# Patient Record
Sex: Female | Born: 1975 | Race: White | Hispanic: No | Marital: Married | Smoking: Never smoker
Health system: Southern US, Community
[De-identification: ages and names within clinical notes are randomized; demographics above are authoritative.]

## PROBLEM LIST (undated history)

## (undated) DIAGNOSIS — N200 Calculus of kidney: Secondary | ICD-10-CM

## (undated) DIAGNOSIS — E7800 Pure hypercholesterolemia, unspecified: Secondary | ICD-10-CM

## (undated) DIAGNOSIS — G43909 Migraine, unspecified, not intractable, without status migrainosus: Secondary | ICD-10-CM

## (undated) HISTORY — PX: KNEE SURGERY: SHX244

---

## 2016-10-09 ENCOUNTER — Emergency Department (HOSPITAL_COMMUNITY): Payer: 59

## 2016-10-09 ENCOUNTER — Encounter (HOSPITAL_COMMUNITY): Payer: Self-pay | Admitting: Emergency Medicine

## 2016-10-09 ENCOUNTER — Emergency Department (HOSPITAL_COMMUNITY)
Admission: EM | Admit: 2016-10-09 | Discharge: 2016-10-09 | Disposition: A | Discharge status: Home / Self Care | Payer: 59 | Attending: Emergency Medicine | Admitting: Emergency Medicine

## 2016-10-09 DIAGNOSIS — R42 Dizziness and giddiness: Secondary | ICD-10-CM | POA: Diagnosis not present

## 2016-10-09 DIAGNOSIS — Z79899 Other long term (current) drug therapy: Secondary | ICD-10-CM | POA: Diagnosis not present

## 2016-10-09 DIAGNOSIS — R51 Headache: Secondary | ICD-10-CM | POA: Diagnosis not present

## 2016-10-09 DIAGNOSIS — R202 Paresthesia of skin: Secondary | ICD-10-CM | POA: Insufficient documentation

## 2016-10-09 DIAGNOSIS — R110 Nausea: Secondary | ICD-10-CM | POA: Diagnosis not present

## 2016-10-09 DIAGNOSIS — R0781 Pleurodynia: Secondary | ICD-10-CM | POA: Insufficient documentation

## 2016-10-09 DIAGNOSIS — M546 Pain in thoracic spine: Secondary | ICD-10-CM | POA: Insufficient documentation

## 2016-10-09 HISTORY — DX: Migraine, unspecified, not intractable, without status migrainosus: G43.909

## 2016-10-09 HISTORY — DX: Pure hypercholesterolemia, unspecified: E78.00

## 2016-10-09 HISTORY — DX: Calculus of kidney: N20.0

## 2016-10-09 LAB — BASIC METABOLIC PANEL
ANION GAP: 9 (ref 5–15)
BUN: 9 mg/dL (ref 6–20)
CHLORIDE: 106 mmol/L (ref 101–111)
CO2: 23 mmol/L (ref 22–32)
Calcium: 8.6 mg/dL — ABNORMAL LOW (ref 8.9–10.3)
Creatinine, Ser: 0.68 mg/dL (ref 0.44–1.00)
GFR calc Af Amer: >60 mL/min (ref >60)
Glucose, Bld: 126 mg/dL — ABNORMAL HIGH (ref 65–99)
POTASSIUM: 3.9 mmol/L (ref 3.5–5.1)
Sodium: 138 mmol/L (ref 135–145)

## 2016-10-09 LAB — CBC
HEMATOCRIT: 40.7 % (ref 36.0–46.0)
HEMOGLOBIN: 13.7 g/dL (ref 12.0–15.0)
MCH: 33.1 pg (ref 26.0–34.0)
MCHC: 33.7 g/dL (ref 30.0–36.0)
MCV: 98.3 fL (ref 78.0–100.0)
Platelets: 193 10*3/uL (ref 150–400)
RBC: 4.14 MIL/uL (ref 3.87–5.11)
RDW: 11.8 % (ref 11.5–15.5)
WBC: 5.2 10*3/uL (ref 4.0–10.5)

## 2016-10-09 LAB — I-STAT BETA HCG BLOOD, ED (MC, WL, AP ONLY)

## 2016-10-09 LAB — TROPONIN I: Troponin I: <0.03 ng/mL (ref <0.03)

## 2016-10-09 LAB — D-DIMER, QUANTITATIVE: D-Dimer, Quant: 0.65 ug/mL-FEU — ABNORMAL HIGH (ref 0.00–0.50)

## 2016-10-09 MED ORDER — METHOCARBAMOL 500 MG PO TABS: 500.0000 mg | ORAL_TABLET | Freq: Three times a day (TID) | ORAL | 0 refills | Status: AC | PRN

## 2016-10-09 MED ORDER — IBUPROFEN 600 MG PO TABS: 600.0000 mg | ORAL_TABLET | Freq: Four times a day (QID) | ORAL | 0 refills | Status: AC | PRN

## 2016-10-09 MED ORDER — ONDANSETRON HCL 4 MG/2ML IJ SOLN
4.0000 mg | Freq: Once | INTRAMUSCULAR | Status: AC
  Administered 2016-10-09: 4 mg via INTRAVENOUS
  Filled 2016-10-09: qty 2

## 2016-10-09 MED ORDER — DIAZEPAM 5 MG/ML IJ SOLN
5.0000 mg | Freq: Once | INTRAMUSCULAR | Status: AC
  Administered 2016-10-09: 5 mg via INTRAVENOUS
  Filled 2016-10-09: qty 2

## 2016-10-09 MED ORDER — IOPAMIDOL (ISOVUE-370) INJECTION 76%
100.0000 mL | Freq: Once | INTRAVENOUS | Status: AC | PRN
  Administered 2016-10-09: 100 mL via INTRAVENOUS

## 2016-10-09 MED ORDER — MORPHINE SULFATE (PF) 4 MG/ML IV SOLN
4.0000 mg | Freq: Once | INTRAVENOUS | Status: AC
  Administered 2016-10-09: 4 mg via INTRAVENOUS
  Filled 2016-10-09: qty 1

## 2016-10-09 NOTE — ED Notes (Signed)
Pt made aware to return if symptoms worsen or if any life threatening symptoms occur.  Pt has no complaints at discharge.

## 2016-10-09 NOTE — ED Notes (Signed)
Pt back from x-ray.

## 2016-10-09 NOTE — ED Triage Notes (Signed)
Per EMS: Pt reports sleeping and waking up a few hours ago, having pain in left shoulder blade radiating into spine and pain into left arm and into left neck with and without movement. Pain with palpation. Pt also reports left hand numbness.

## 2016-10-09 NOTE — ED Provider Notes (Signed)
[facility]-EM[facility]Y DEPT Provider Note   CSN: [id number] Arrival date & time: 10/09/16  1348     History   Chief Complaint Chief Complaint  Patient presents with  . Chest Pain    HPI Christy Lyons is a 41 y.o. female.  The history is provided by the patient.  Back Pain   This is a new problem. The current episode started 3 to 5 hours ago. The problem occurs constantly. The problem has been gradually worsening. The pain is associated with no known injury. Pain location: left scapula. The quality of the pain is described as stabbing. Radiates to: left neck and down left arm. The pain is severe. The symptoms are aggravated by certain positions (deep inspiration). The pain is the same all the time. Associated symptoms include headaches and tingling. Pertinent negatives include no fever, no abdominal pain, no abdominal swelling, no bowel incontinence, no perianal numbness, no dysuria, no leg pain and no weakness. Numbness: tingling of the left hand. She has tried heat for the symptoms. The treatment provided no relief.   41 year old female who presents with upper back pain that radiates to the neck and down the left arm starting this morning when she woke up at 10:30 AM. Pain gradually worsened, and does notice that it worsens if she might changes position or moves, and with palpation. Also worsens if she takes a deep breath. Has been feeling lightheaded and nauseous with this but no syncope. Has been having tingling in the left hand, but slowly improving. No fall or trauma or heavy lifting. No cough, fevers, difficulty breathing or syncope. There is never happened to her before. No recent immobilization or prior history of PE/DVT.  Past Medical History:  Diagnosis Date  . High cholesterol   . Kidney stone   . Migraine     There are no active problems to display for this patient.   Past Surgical History:  Procedure Laterality Date  . CESAREAN SECTION    . KNEE SURGERY      OB History     No data available       Home Medications    Prior to Admission medications   Medication Sig Start Date End Date Taking? Authorizing Provider  atorvastatin (LIPITOR) 40 MG tablet Take 20-40 mg by mouth every evening.    Yes Historical Provider, MD  escitalopram (LEXAPRO) 20 MG tablet Take 10-20 mg by mouth at bedtime.    Yes Historical Provider, MD  ibuprofen (ADVIL,MOTRIN) 200 MG tablet Take 800 mg by mouth daily as needed for moderate pain.   Yes Historical Provider, MD  ramelteon (ROZEREM) 8 MG tablet Take 8 mg by mouth at bedtime.   Yes Historical Provider, MD  topiramate (TOPAMAX) 25 MG tablet Take 50 mg by mouth every evening.   Yes Historical Provider, MD  ibuprofen (ADVIL,MOTRIN) 600 MG tablet Take 1 tablet (600 mg total) by mouth every 6 (six) hours as needed for mild pain or moderate pain (take with food/crackers). 10/09/16   [name], MD  methocarbamol (ROBAXIN) 500 MG tablet Take 1 tablet (500 mg total) by mouth every 8 (eight) hours as needed for muscle spasms. 10/09/16   [name], MD    Family History History reviewed. No pertinent family history.  Social History Social History  Substance Use Topics  . Smoking status: Never Smoker  . Smokeless tobacco: Not on file  . Alcohol use Yes     Allergies   Hydrocodone   Review of Systems  Review of Systems  Constitutional: Negative for fever.  HENT: Negative for congestion.   Respiratory: Negative for cough and shortness of breath.   Cardiovascular: Negative for leg swelling.  Gastrointestinal: Positive for nausea. Negative for abdominal pain, bowel incontinence and vomiting.  Genitourinary: Negative for dysuria.  Musculoskeletal: Positive for back pain.  Allergic/Immunologic: Negative for immunocompromised state.  Neurological: Positive for tingling, light-headedness and headaches. Negative for weakness. Numbness: tingling of the left hand.  Hematological: Does not bruise/bleed easily.    Psychiatric/Behavioral: Negative for confusion.  All other systems reviewed and are negative.    Physical Exam Updated Vital Signs BP 104/65   Pulse 68 Comment: Simultaneous filing. User may not have seen previous data.  Temp 98.1 F (36.7 C) (Oral)   Resp 18 Comment: Simultaneous filing. User may not have seen previous data.  Ht  (1.702 m)   Wt 156 lb (70.8 kg)   SpO2 100% Comment: Simultaneous filing. User may not have seen previous data.  BMI 24.43 kg/m   Physical Exam Physical Exam  Nursing note and vitals reviewed. Constitutional:non-toxic, and in no acute distress Head: Normocephalic and atraumatic.  Mouth/Throat: Oropharynx is clear and moist.  Neck: Normal range of motion. Neck supple.  Cardiovascular: Normal rate and regular rhythm.   Pulmonary/Chest: Effort normal and breath sounds normal. +2 dp and radial pulses bilaterally Abdominal: Soft. There is no tenderness. There is no rebound and no guarding.  Musculoskeletal: Normal range of motion. Tender over the left upper thoracic paraspinal muscles around the base of the left scapula Neurological: Alert, no facial droop, fluent speech, moves all extremities symmetrically Skin: Skin is warm and dry.  Psychiatric: Cooperative   ED Treatments / Results  Labs (all labs ordered are listed, but only abnormal results are displayed) Labs Reviewed  BASIC METABOLIC PANEL - Abnormal; Notable for the following:       Result Value   Glucose, Bld 126 (*)    Calcium 8.6 (*)    All other components within normal limits  D-DIMER, QUANTITATIVE (NOT AT [facility]) - Abnormal; Notable for the following:    D-Dimer, Quant 0.65 (*)    All other components within normal limits  CBC  TROPONIN I  TROPONIN I  I-STAT BETA HCG BLOOD, ED ([facility], [facility], [facility] ONLY)    EKG  EKG Interpretation  Date/Time:  Tuesday October 09 2016 13:59:12 EDT Ventricular Rate:  76 PR Interval:    QRS Duration: 96 QT Interval:  383 QTC Calculation: 431 R  Axis:   84 Text Interpretation:  Sinus rhythm no prior EKG no acute ischemic changes  Confirmed by Smitty Ackerley MD, Jamar Weatherall [phone]) on 10/09/2016 2:07:51 PM       Radiology Dg Chest 2 View  Result Date: 10/09/2016 CLINICAL DATA:  Chest pain, left hand numbness EXAM: CHEST  2 VIEW COMPARISON:  None. FINDINGS: No active infiltrate or effusion is seen. Mediastinal and hilar contours are unremarkable. The heart is within normal limits in size. No bony abnormality is seen. IMPRESSION: No active cardiopulmonary disease. Electronically Signed   By: Dwyane Dee M.D.   On: 10/09/2016 15:22   Ct Angio Chest Pe W And/or Wo Contrast  Result Date: 10/09/2016 CLINICAL DATA:  New onset left scapular pain. Pleuritic left-sided chest pain. Left arm tingling and lightheadedness. EXAM: CT ANGIOGRAPHY CHEST WITH CONTRAST TECHNIQUE: Multidetector CT imaging of the chest was performed using the standard protocol during bolus administration of intravenous contrast. Multiplanar CT image reconstructions and MIPs were obtained to evaluate  the vascular anatomy. CONTRAST:  100 mL Isovue 370 COMPARISON:  Two-view chest x-ray from the same day. FINDINGS: Cardiovascular: The heart size is normal. No significant pericardial effusion is present. The aortic arch is normal. There is no evidence for dissection within the aortic arch or descending aorta. Aorta is of normal caliber. Pulmonary arterial opacification is excellent. There are no focal filling defects to suggest pulmonary emboli. Pulmonary arteries are of normal size. Mediastinum/Nodes: No significant mediastinal or axillary adenopathy is present. The esophagus is unremarkable. Lungs/Pleura: The lungs are clear. No focal nodule, mass, or airspace disease is present. There is no significant pleural effusion. Upper Abdomen: Limited imaging of the upper abdomen is unremarkable. Musculoskeletal: Bone windows are unremarkable. The visualized ribs are intact. The scapulae are normal bilaterally.  Visualized vertebral body heights and alignment are normal. There is no focal lytic or blastic lesion. No acute fracture is present. Review of the MIP images confirms the above findings. IMPRESSION: 1. Negative CTA of the chest. 2. No evidence for pulmonary embolus or aortic dissection. 3. No acute or focal lesion to explain the patient's left-sided chest pain. Electronically Signed   By: Marin Roberts M.D.   On: 10/09/2016 16:49    Procedures Procedures (including critical care time)  Medications Ordered in ED Medications  morphine 4 MG/ML injection 4 mg (4 mg Intravenous Given 10/09/16 1500)  ondansetron (ZOFRAN) injection 4 mg (4 mg Intravenous Given 10/09/16 1458)  diazepam (VALIUM) injection 5 mg (5 mg Intravenous Given 10/09/16 1502)  iopamidol (ISOVUE-370) 76 % injection 100 mL (100 mLs Intravenous Contrast Given 10/09/16 1629)     Initial Impression / Assessment and Plan / ED Course  I have reviewed the triage vital signs and the nursing notes.  Pertinent labs & imaging results that were available during my care of the patient were reviewed by me and considered in my medical decision making (see chart for details).     Patient well appearing and in no acute distress with normal vital signs. Pain seems reproduced over left scapula. Low risk ACS, and serial troponins normal and with nonischemic EKG. CXR visualized and shows no acute cardiopulmonary processes. Patient with positive ddimer and underwent CT PE. CT visualized and shows no PE, dissection or other serious intrathoracic or cardiopulmonary processes. Felt to be ruled out for emergent processes and appropriate for discharge home with instructions to treat likely MSK strain. Strict return and follow-up instructions reviewed. She expressed understanding of all discharge instructions and felt comfortable with the plan of care.   Final Clinical Impressions(s) / ED Diagnoses   Final diagnoses:  Acute left-sided thoracic back  pain    New Prescriptions New Prescriptions   IBUPROFEN (ADVIL,MOTRIN) 600 MG TABLET    Take 1 tablet (600 mg total) by mouth every 6 (six) hours as needed for mild pain or moderate pain (take with food/crackers).   METHOCARBAMOL (ROBAXIN) 500 MG TABLET    Take 1 tablet (500 mg total) by mouth every 8 (eight) hours as needed for muscle spasms.     [name], MD 10/09/16 [phone]

## 2016-10-09 NOTE — Discharge Instructions (Signed)
Your CT scan of the chest and heart work up is reassuring. This is likely muscle strain. Take medications as prescribed. Follow-up with your PCP Return for worsening symptoms, including difficulty breathing, passing out, escalating pain or any other symptoms concerning to you.

## 2017-05-14 ENCOUNTER — Ambulatory Visit (INDEPENDENT_AMBULATORY_CARE_PROVIDER_SITE_OTHER): Payer: 59 | Admitting: Neurology

## 2017-05-14 ENCOUNTER — Encounter: Payer: Self-pay | Admitting: Neurology

## 2017-05-14 ENCOUNTER — Ambulatory Visit: Payer: 59 | Admitting: Neurology

## 2017-05-14 DIAGNOSIS — M79601 Pain in right arm: Secondary | ICD-10-CM

## 2017-05-14 NOTE — Progress Notes (Signed)
Please refer to EMG and nerve conduction study procedure note. 

## 2017-05-14 NOTE — Procedures (Signed)
     HISTORY:  Christy Lyons is a 41 year old patient with a history of onset of right shoulder and arm discomfort that began in June 2018.  She was putting together furniture and she heard a pop in her shoulder and has had discomfort down the arm since that time associated with some numbness of the right hand.  The patient is being evaluated for a possible neuropathy or a cervical radiculopathy.  NERVE CONDUCTION STUDIES:  Nerve conduction studies were performed on both upper extremities. The distal motor latencies and motor amplitudes for the median, radial, and ulnar nerves were within normal limits. The F wave latencies and nerve conduction velocities for the median and ulnar nerves were also normal. The sensory latencies for the median, radial, and ulnar nerves were normal.   EMG STUDIES:  EMG study was performed on the right upper extremity:  The first dorsal interosseous muscle reveals 2 to 4 K units with full recruitment. No fibrillations or positive waves were noted. The abductor pollicis brevis muscle reveals 2 to 4 K units with full recruitment. No fibrillations or positive waves were noted. The extensor indicis proprius muscle reveals 1 to 3 K units with full recruitment. No fibrillations or positive waves were noted. The pronator teres muscle reveals 2 to 3 K units with full recruitment. No fibrillations or positive waves were noted. The biceps muscle reveals 1 to 2 K units with full recruitment. No fibrillations or positive waves were noted. The triceps muscle reveals 2 to 4 K units with full recruitment. No fibrillations or positive waves were noted. The anterior deltoid muscle reveals 2 to 3 K units with full recruitment. No fibrillations or positive waves were noted. The cervical paraspinal muscles were tested at 2 levels. No abnormalities of insertional activity were seen at either level tested. There was poor relaxation.   IMPRESSION:  Nerve conduction studies done on  both upper extremities were within normal limits.  No evidence of a neuropathy is seen.  EMG evaluation of the right upper extremity was unremarkable without evidence of a cervical radiculopathy.  Marlan Palau. Keith Otniel Hoe MD 05/14/2017 3:55 PM  [facility] [address] [place], [place] [id number]  Phone [phone] Fax [phone]

## 2017-05-20 NOTE — Progress Notes (Signed)
[facility]    Nerve / Sites Muscle Latency Ref. Amplitude Ref. Rel Amp Segments Distance Velocity Ref. Area    ms ms mV mV %  cm m/s m/s mVms  L Median - APB     Wrist APB 2.8 ?4.4 13.5 ?4.0 100 Wrist - APB 7   48.8     Upper arm APB 6.6  13.5  99.4 Upper arm - Wrist 23 60 ?49 48.5  R Median - APB     Wrist APB 3.2 ?4.4 6.4 ?4.0 100 Wrist - APB 7   22.6     Upper arm APB 6.8  6.2  96.5 Upper arm - Wrist 23 65 ?49 22.8  L Ulnar - ADM     Wrist ADM 2.7 ?3.3 6.8 ?6.0 100 Wrist - ADM 7   22.7     B.Elbow ADM 5.9  6.2  90.7 B.Elbow - Wrist 21 65 ?49 21.9     A.Elbow ADM 7.4  5.7  92.5 A.Elbow - B.Elbow 10 66 ?49 21.2         A.Elbow - Wrist      R Ulnar - ADM     Wrist ADM 2.7 ?3.3 3.6 ?6.0 100 Wrist - ADM 7   12.3     B.Elbow ADM 6.4  3.1  85.6 B.Elbow - Wrist 21 56 ?49 10.9     A.Elbow ADM 8.0  2.9  91.8 A.Elbow - B.Elbow 10 62 ?49 9.3         A.Elbow - Wrist      L Radial - EIP     Forearm EIP 2.6 ?2.9 3.7 ?2.0 100 Forearm - EIP 4  ?49 28.7     Elbow EIP 4.9  4.7  127 Elbow - Forearm 19 83  35.7     Spiral Gr EIP 8.9  6.3  132 Spiral Gr - Elbow 38 95  44.4  R Radial - EIP     Forearm EIP 3.2 ?2.9 4.3 ?2.0 100 Forearm - EIP 4  ?49 27.1     Elbow EIP 6.0  4.4  103 Elbow - Forearm    29.0     Spiral Gr EIP 8.1  5.4  124 Spiral Gr - Elbow    34.6                 [facility]    Nerve / Sites Rec. Site Peak Lat Amp Segments Distance    ms V  cm  L Median - Orthodromic (Dig II, Mid palm)     Dig II Wrist 2.8 77 Dig II - Wrist 13  R Median - Orthodromic (Dig II, Mid palm)     Dig II Wrist 3.1 72 Dig II - Wrist 13  L Ulnar - Orthodromic, (Dig V, Mid palm)     Dig V Wrist 2.4 77 Dig V - Wrist 11  R Ulnar - Orthodromic, (Dig V, Mid palm)     Dig V Wrist 2.9 67 Dig V - Wrist 11             [facility]NC    Nerve / Sites Rec. Site Peak Lat Ref.  Amp Ref. Segments Distance    ms ms V V  cm  L Radial - Anatomical snuff box (Forearm)     Forearm Wrist 2.2 ?2.9 85 ?15 Forearm - Wrist 10  R Radial -  Anatomical snuff box (Forearm)     Forearm Wrist 2.2 ?2.9 68 ?15 Forearm -  Wrist 10         F  Wave    Nerve F Lat Ref.   ms ms  L Median - APB 24.8 ?31.0  L Ulnar - ADM 25.3 ?32.0  R Median - APB 25.9 ?31.0  R Ulnar - ADM 26.4 ?32.0

## 2018-03-24 ENCOUNTER — Other Ambulatory Visit: Payer: Self-pay | Admitting: Internal Medicine

## 2018-03-24 DIAGNOSIS — Z1231 Encounter for screening mammogram for malignant neoplasm of breast: Secondary | ICD-10-CM

## 2018-04-23 ENCOUNTER — Ambulatory Visit: Payer: 59

## 2018-06-10 ENCOUNTER — Ambulatory Visit
Admission: RE | Admit: 2018-06-10 | Discharge: 2018-06-10 | Disposition: A | Discharge status: Home / Self Care | Payer: 59 | Source: Ambulatory Visit | Attending: Internal Medicine | Admitting: Internal Medicine

## 2018-06-10 DIAGNOSIS — Z1231 Encounter for screening mammogram for malignant neoplasm of breast: Secondary | ICD-10-CM

## 2019-04-13 ENCOUNTER — Ambulatory Visit
Admission: RE | Admit: 2019-04-13 | Discharge: 2019-04-13 | Disposition: A | Discharge status: Home / Self Care | Payer: 59 | Source: Ambulatory Visit | Attending: Nurse Practitioner | Admitting: Nurse Practitioner

## 2019-04-13 ENCOUNTER — Other Ambulatory Visit: Payer: Self-pay | Admitting: Nurse Practitioner

## 2019-04-13 DIAGNOSIS — M25431 Effusion, right wrist: Secondary | ICD-10-CM

## 2019-04-13 DIAGNOSIS — M25531 Pain in right wrist: Secondary | ICD-10-CM

## 2019-07-17 ENCOUNTER — Other Ambulatory Visit: Payer: Self-pay | Admitting: Internal Medicine

## 2019-07-17 DIAGNOSIS — Z1231 Encounter for screening mammogram for malignant neoplasm of breast: Secondary | ICD-10-CM

## 2019-08-26 ENCOUNTER — Ambulatory Visit
Admission: RE | Admit: 2019-08-26 | Discharge: 2019-08-26 | Disposition: A | Discharge status: Home / Self Care | Payer: 59 | Source: Ambulatory Visit | Attending: Internal Medicine | Admitting: Internal Medicine

## 2019-08-26 ENCOUNTER — Other Ambulatory Visit: Payer: Self-pay

## 2019-08-26 DIAGNOSIS — Z1231 Encounter for screening mammogram for malignant neoplasm of breast: Secondary | ICD-10-CM

## 2020-08-13 ENCOUNTER — Encounter (HOSPITAL_COMMUNITY): Payer: Self-pay | Admitting: Radiology

## 2020-08-13 ENCOUNTER — Emergency Department (HOSPITAL_COMMUNITY): Payer: 59

## 2020-08-13 ENCOUNTER — Other Ambulatory Visit: Payer: Self-pay

## 2020-08-13 ENCOUNTER — Emergency Department (HOSPITAL_COMMUNITY)
Admission: EM | Admit: 2020-08-13 | Discharge: 2020-08-13 | Disposition: A | Discharge status: Home / Self Care | Payer: 59 | Attending: Emergency Medicine | Admitting: Emergency Medicine

## 2020-08-13 DIAGNOSIS — R109 Unspecified abdominal pain: Secondary | ICD-10-CM | POA: Diagnosis present

## 2020-08-13 DIAGNOSIS — Z87442 Personal history of urinary calculi: Secondary | ICD-10-CM | POA: Insufficient documentation

## 2020-08-13 DIAGNOSIS — N201 Calculus of ureter: Secondary | ICD-10-CM

## 2020-08-13 LAB — COMPREHENSIVE METABOLIC PANEL
ALT: 37 U/L (ref 0–44)
AST: 29 U/L (ref 15–41)
Albumin: 4.2 g/dL (ref 3.5–5.0)
Alkaline Phosphatase: 52 U/L (ref 38–126)
Anion gap: 11 (ref 5–15)
BUN: 12 mg/dL (ref 6–20)
CO2: 19 mmol/L — ABNORMAL LOW (ref 22–32)
Calcium: 8.8 mg/dL — ABNORMAL LOW (ref 8.9–10.3)
Chloride: 105 mmol/L (ref 98–111)
Creatinine, Ser: 0.79 mg/dL (ref 0.44–1.00)
GFR, Estimated: >60 mL/min (ref >60)
Glucose, Bld: 122 mg/dL — ABNORMAL HIGH (ref 70–99)
Potassium: 3.8 mmol/L (ref 3.5–5.1)
Sodium: 135 mmol/L (ref 135–145)
Total Bilirubin: 0.7 mg/dL (ref 0.3–1.2)
Total Protein: 7.3 g/dL (ref 6.5–8.1)

## 2020-08-13 LAB — URINALYSIS, ROUTINE W REFLEX MICROSCOPIC
Bilirubin Urine: NEGATIVE
Glucose, UA: NEGATIVE mg/dL
Hgb urine dipstick: NEGATIVE
Ketones, ur: 20 mg/dL — AB
Nitrite: NEGATIVE
Protein, ur: 30 mg/dL — AB
Specific Gravity, Urine: 1.024 (ref 1.005–1.030)
pH: 8 (ref 5.0–8.0)

## 2020-08-13 LAB — CBC
HCT: 39.2 % (ref 36.0–46.0)
Hemoglobin: 13.3 g/dL (ref 12.0–15.0)
MCH: 33.1 pg (ref 26.0–34.0)
MCHC: 33.9 g/dL (ref 30.0–36.0)
MCV: 97.5 fL (ref 80.0–100.0)
Platelets: 241 10*3/uL (ref 150–400)
RBC: 4.02 MIL/uL (ref 3.87–5.11)
RDW: 11.9 % (ref 11.5–15.5)
WBC: 6.2 10*3/uL (ref 4.0–10.5)
nRBC: 0 % (ref 0.0–0.2)

## 2020-08-13 LAB — POC URINE PREG, ED: Preg Test, Ur: NEGATIVE

## 2020-08-13 MED ORDER — IOHEXOL 300 MG/ML  SOLN
100.0000 mL | Freq: Once | INTRAMUSCULAR | Status: AC | PRN
  Administered 2020-08-13: 100 mL via INTRAVENOUS

## 2020-08-13 MED ORDER — ONDANSETRON 4 MG PO TBDP: 4.0000 mg | ORAL_TABLET | Freq: Three times a day (TID) | ORAL | 0 refills | Status: AC | PRN

## 2020-08-13 MED ORDER — OXYCODONE-ACETAMINOPHEN 5-325 MG PO TABS: 1.0000 | ORAL_TABLET | Freq: Four times a day (QID) | ORAL | 0 refills | Status: DC | PRN

## 2020-08-13 MED ORDER — PROMETHAZINE HCL 25 MG/ML IJ SOLN
25.0000 mg | Freq: Once | INTRAMUSCULAR | Status: AC
  Administered 2020-08-13: 25 mg via INTRAVENOUS
  Filled 2020-08-13: qty 1

## 2020-08-13 MED ORDER — FENTANYL CITRATE (PF) 100 MCG/2ML IJ SOLN
50.0000 ug | Freq: Once | INTRAMUSCULAR | Status: AC
  Administered 2020-08-13: 50 ug via INTRAVENOUS
  Filled 2020-08-13: qty 2

## 2020-08-13 MED ORDER — TAMSULOSIN HCL 0.4 MG PO CAPS: 0.4000 mg | ORAL_CAPSULE | Freq: Every day | ORAL | 0 refills | Status: AC

## 2020-08-13 MED ORDER — KETOROLAC TROMETHAMINE 30 MG/ML IJ SOLN
30.0000 mg | Freq: Once | INTRAMUSCULAR | Status: AC
  Administered 2020-08-13: 30 mg via INTRAVENOUS
  Filled 2020-08-13: qty 1

## 2020-08-13 NOTE — ED Notes (Signed)
Negative Urine Pregnancy Test

## 2020-08-13 NOTE — ED Provider Notes (Signed)
[facility] EMERGENCY DEPARTMENT Provider Note   CSN: [id number] Arrival date & time: 08/13/20  1034     History Chief Complaint  Patient presents with  . Abdominal Pain    Right side Pain sudden onset     Lewis ShockChristine Gassmann is a 45 y.o. female with past medical history significant for obstructive kidney stones and ureteric stent procedures who presents to the ED via EMS for acute onset right-sided flank pain.  On my exam, patient reports that 10 out of 10 right-sided abdominal pain woke her from her sleep this morning at approximately 8:30 AM.  She states that her last obstructing kidney stone was 19 years ago and felt different as it was located in her flank.  She states that her pain today is predominantly anterior.  She felt perfectly fine last evening when she went to bed, without any crampy abdominal discomfort, recent fevers, chills, nausea, or other symptoms.  This morning, her severe and sudden onset right-sided abdominal pain caused her to feel nauseated and she has had an episode of nonbloody emesis.  She also is mildly hyperventilating here in triage which is contributing to worsening nausea and generalized paresthesias.  She states that she cannot take hydrocodone due to prior allergy of severe nausea.  History was obtained by EMS and they administered Zofran for her nausea symptoms.    She has an IUD and has low suspicion for pregnancy.  Denies any history of ovarian cysts.  Denies vaginal pain, vaginal bleeding, discharge, pelvic discomfort, hematemesis, chest pain, cough, fevers or chills, or obvious provoking/aggravating factors.  HPI     Past Medical History:  Diagnosis Date  . High cholesterol   . Kidney stone   . Migraine     Patient Active Problem List   Diagnosis Date Noted  . Right arm pain 05/14/2017    Past Surgical History:  Procedure Laterality Date  . CESAREAN SECTION    . KNEE SURGERY       OB History   No obstetric history on file.     No  family history on file.  Social History   Tobacco Use  . Smoking status: Never Smoker  Substance Use Topics  . Alcohol use: Yes  . Drug use: No    Home Medications Prior to Admission medications   Medication Sig Start Date End Date Taking? Authorizing Provider  ondansetron (ZOFRAN ODT) 4 MG disintegrating tablet Take 1 tablet (4 mg total) by mouth every 8 (eight) hours as needed for nausea or vomiting. 08/13/20  Yes Lorelee NewGreen, Rula Keniston L, PA-C  oxyCODONE-acetaminophen (PERCOCET/ROXICET) 5-325 MG tablet Take 1 tablet by mouth every 6 (six) hours as needed for up to 9 doses for severe pain. 08/13/20  Yes Lorelee NewGreen, Nuno Brubacher L, PA-C  tamsulosin (FLOMAX) 0.4 MG CAPS capsule Take 1 capsule (0.4 mg total) by mouth daily for 14 days. 08/13/20 08/27/20 Yes Lorelee NewGreen, Zenna Traister L, PA-C  atorvastatin (LIPITOR) 40 MG tablet Take 20-40 mg by mouth every evening.     [provider]  escitalopram (LEXAPRO) 20 MG tablet Take 10-20 mg by mouth at bedtime.     [provider]  ibuprofen (ADVIL,MOTRIN) 200 MG tablet Take 800 mg by mouth daily as needed for moderate pain.    [provider]  ibuprofen (ADVIL,MOTRIN) 600 MG tablet Take 1 tablet (600 mg total) by mouth every 6 (six) hours as needed for mild pain or moderate pain (take with food/crackers). 10/09/16   Lavera GuiseLiu, Dana Duo, MD  methocarbamol Nicholaus Corolla(ROBAXIN)  500 MG tablet Take 1 tablet (500 mg total) by mouth every 8 (eight) hours as needed for muscle spasms. 10/09/16   [name], MD  ramelteon (ROZEREM) 8 MG tablet Take 8 mg by mouth at bedtime.    [provider]  topiramate (TOPAMAX) 25 MG tablet Take 50 mg by mouth every evening.    [provider]    Allergies    Hydrocodone  Review of Systems   Review of Systems  All other systems reviewed and are negative.   Physical Exam Updated Vital Signs BP 101/64   Pulse 65   Temp 98.2 F (36.8 C)   Resp 14   Ht 5\' 6"  (1.676 m)   Wt 74.8 kg   SpO2 100%   BMI 26.63 kg/m    Physical Exam Vitals and nursing note reviewed. Exam conducted with a chaperone present.  Constitutional:      Comments: In pain.  HENT:     Head: Normocephalic and atraumatic.  Eyes:     General: No scleral icterus.    Conjunctiva/sclera: Conjunctivae normal.  Cardiovascular:     Rate and Rhythm: Normal rate.     Pulses: Normal pulses.  Pulmonary:     Effort: Pulmonary effort is normal. No respiratory distress.  Abdominal:     General: Abdomen is flat. There is no distension.     Palpations: Abdomen is soft. There is no mass.     Tenderness: There is abdominal tenderness. There is right CVA tenderness. There is no guarding or rebound.     Hernia: No hernia is present.     Comments: Soft, nondistended.  Mild right-sided abdominal tenderness.  Negative Murphy sign.  Unclear McBurney's point tenderness.  Negative Rovsing sign.  Positive right-sided CVAT.  Negative left-sided CVAT.  No peritoneal signs.  Musculoskeletal:     Right lower leg: No edema.     Left lower leg: No edema.  Skin:    General: Skin is dry.  Neurological:     Mental Status: She is alert and oriented to person, place, and time.     GCS: GCS eye subscore is 4. GCS verbal subscore is 5. GCS motor subscore is 6.  Psychiatric:        Mood and Affect: Mood normal.        Behavior: Behavior normal.        Thought Content: Thought content normal.     ED Results / Procedures / Treatments   Labs (all labs ordered are listed, but only abnormal results are displayed) Labs Reviewed  URINALYSIS, ROUTINE W REFLEX MICROSCOPIC - Abnormal; Notable for the following components:      Result Value   APPearance HAZY (*)    Ketones, ur 20 (*)    Protein, ur 30 (*)    Leukocytes,Ua MODERATE (*)    Bacteria, UA RARE (*)    All other components within normal limits  COMPREHENSIVE METABOLIC PANEL - Abnormal; Notable for the following components:   CO2 19 (*)    Glucose, Bld 122 (*)    Calcium 8.8 (*)    All other  components within normal limits  URINE CULTURE  CBC  POC URINE PREG, ED    EKG None  Radiology CT ABDOMEN PELVIS W CONTRAST  Result Date: 08/13/2020 CLINICAL DATA:  Right lower quadrant abdominal pain and nausea. History of nephrolithiasis. EXAM: CT ABDOMEN AND PELVIS WITH CONTRAST TECHNIQUE: Multidetector CT imaging of the abdomen and pelvis was performed using the standard  protocol following bolus administration of intravenous contrast. CONTRAST:  OMNIPAQUE IOHEXOL 300 MG/ML  SOLN COMPARISON:  None. FINDINGS: Lower chest: No significant pulmonary nodules or acute consolidative airspace disease. Hepatobiliary: Normal liver size. Two scattered subcentimeter hypodense left liver lesions are too small to characterize and require no follow-up unless the patient has risk factors for liver malignancy. No additional liver lesions. Normal gallbladder with no radiopaque cholelithiasis. No biliary ductal dilatation. Pancreas: Normal, with no mass or duct dilation. Spleen: Normal size. No mass. Adrenals/Urinary Tract: Normal adrenals. Obstructing 2 mm right ureterovesical junction stone with mild right hydroureteronephrosis and delayed right contrast nephrogram. Asymmetric mild right perinephric edema. No contour deforming renal masses. No left hydronephrosis. Normal collapsed bladder. Stomach/Bowel: Normal non-distended stomach. Normal caliber small bowel with no small bowel wall thickening. Normal appendix. Normal large bowel with no diverticulosis, large bowel wall thickening or pericolonic fat stranding. Vascular/Lymphatic: Minimally atherosclerotic nonaneurysmal abdominal aorta. Patent portal, splenic, hepatic and renal veins. No pathologically enlarged lymph nodes in the abdomen or pelvis. Reproductive: Retroverted uterus with 2.3 cm posterior right uterine body fibroid and grossly well-positioned intrauterine device in the uterine cavity. Bilateral simple adnexal cysts, largest 2.5 cm on the right  (series 2/image 72). No follow-up imaging recommended. Note: This recommendation does not apply to premenarchal patients and to those with increased risk (genetic, family history, elevated tumor markers or other high-risk factors) of ovarian cancer. Reference: JACR 2020 Feb; 17(2):248-254 Other: No pneumoperitoneum, ascites or focal fluid collection. Musculoskeletal: No aggressive appearing focal osseous lesions. IMPRESSION: 1. Obstructing 2 mm right UVJ stone with mild right hydroureteronephrosis. 2. Small uterine fibroid. 3. Aortic Atherosclerosis (ICD10-I70.0). Electronically Signed   By: Delbert Phenix M.D.   On: 08/13/2020 12:41    Procedures Procedures   Medications Ordered in ED Medications  ketorolac (TORADOL) 30 MG/ML injection 30 mg (30 mg Intravenous Given 08/13/20 1128)  iohexol (OMNIPAQUE) 300 MG/ML solution 100 mL (100 mLs Intravenous Contrast Given 08/13/20 1220)  promethazine (PHENERGAN) injection 25 mg (25 mg Intravenous Given 08/13/20 1234)  fentaNYL (SUBLIMAZE) injection 50 mcg (50 mcg Intravenous Given 08/13/20 1233)    ED Course  I have reviewed the triage vital signs and the nursing notes.  Pertinent labs & imaging results that were available during my care of the patient were reviewed by me and considered in my medical decision making (see chart for details).    MDM Rules/Calculators/A&P                          Larina Lieurance was evaluated in Emergency Department on 08/13/2020 for the symptoms described in the history of present illness. She was evaluated in the context of the global COVID-19 pandemic, which necessitated consideration that the patient might be at risk for infection with the SARS-CoV-2 virus that causes COVID-19. Institutional protocols and algorithms that pertain to the evaluation of patients at risk for COVID-19 are in a state of rapid change based on information released by regulatory bodies including the CDC and federal and state organizations. These  policies and algorithms were followed during the patient's care in the ED.  I personally reviewed patient's medical chart and all notes from triage and staff during today's encounter. I have also ordered and reviewed all labs and imaging that I felt to be medically necessary in the evaluation of this patient's complaints and with consideration of their physical exam. If needed, translation services were available and utilized.   CT abdomen pelvis obtained  with contrast demonstrates an obstructing 2 mm right UVJ stone with mild right-sided hydronephrosis.  CBC without leukocytosis.  Patient is afebrile.  Symptoms began this morning.  Her pain symptoms have been waxing and waning.  CMP is unremarkable and with preserved renal function.  However, UA demonstrates questionable infection with 21-50 WBC and rare bacteria, likely the 11-20 squamous epithelial cells.  She denies any urinary symptoms.  Given her significant pain and questionable UTI, will at least consult on-call urology.  She states that she cannot take hydrocodone due to severe nausea.  She agreed to trying fentanyl with Phenergan premedication to help mitigate nausea symptoms.  She did have some improvement with Toradol, but was short lasting.  Patient tells me that she has actually tolerated Percocet well.  Given that I have low suspicion for infected stone, will discharge her home with pain control and antiemetics.  We will also discharge her home with Flomax.  We will provide her with a referral to local urologist for ongoing evaluation and management.  Recommending that she call them first thing on Monday to schedule appointment.  Strict ED return precautions discussed.  Particular if she develops any fevers, intractable pain, intractable nausea vomiting, or any other new or worsening symptoms.  Patient and husband who is at bedside voiced understanding and are agreeable to the plan.   Final Clinical Impression(s) / ED Diagnoses Final  diagnoses:  Ureterolithiasis    Rx / DC Orders ED Discharge Orders         Ordered    oxyCODONE-acetaminophen (PERCOCET/ROXICET) 5-325 MG tablet  Every 6 hours PRN        08/13/20 1425    ondansetron (ZOFRAN ODT) 4 MG disintegrating tablet  Every 8 hours PRN        08/13/20 1425    tamsulosin (FLOMAX) 0.4 MG CAPS capsule  Daily        08/13/20 1425           Elvera Maria 08/13/20 1426    Mancel Bale, MD 08/13/20 [phone]

## 2020-08-13 NOTE — Discharge Instructions (Addendum)
Please follow-up with your primary care provider regarding today's encounter.  I would also like you to call the office of Dr. Liliane Shi, urologist, for ongoing evaluation management of your kidney stones.  Please take ibuprofen 600 mg every 6 hours as needed for pain control.  Please take the prescribed Percocet as needed if pain not well controlled by ibuprofen.  I have prescribed you Zofran ODT for nausea symptoms, as needed.  I have also prescribed you Flomax which I would like for you to take to help facilitate passage of the stone.  Please return to the ED or seek immediate medical attention should you experience any fevers, worsening abdominal pain, intractable nausea and vomiting, intractable pain symptoms, or any other new or symptoms.

## 2020-08-13 NOTE — ED Triage Notes (Signed)
Pt has sudden onset of right side flank pain, history of kidney stones with stent.  Alert and oriented skin cool and clammy, hyperventilating in pain

## 2020-08-15 LAB — URINE CULTURE: Culture: <10000 — AB

## 2021-03-20 ENCOUNTER — Emergency Department (HOSPITAL_COMMUNITY)
Admission: EM | Admit: 2021-03-20 | Discharge: 2021-03-20 | Disposition: A | Discharge status: Home / Self Care | Payer: 59 | Attending: Emergency Medicine | Admitting: Emergency Medicine

## 2021-03-20 ENCOUNTER — Encounter (HOSPITAL_COMMUNITY): Payer: Self-pay | Admitting: *Deleted

## 2021-03-20 DIAGNOSIS — G8929 Other chronic pain: Secondary | ICD-10-CM | POA: Diagnosis not present

## 2021-03-20 DIAGNOSIS — M25512 Pain in left shoulder: Secondary | ICD-10-CM | POA: Insufficient documentation

## 2021-03-20 DIAGNOSIS — M546 Pain in thoracic spine: Secondary | ICD-10-CM | POA: Insufficient documentation

## 2021-03-20 MED ORDER — HYDROMORPHONE HCL 1 MG/ML IJ SOLN
1.0000 mg | Freq: Once | INTRAMUSCULAR | Status: AC
  Administered 2021-03-20: 1 mg via INTRAMUSCULAR
  Filled 2021-03-20: qty 1

## 2021-03-20 MED ORDER — HYDROMORPHONE HCL 1 MG/ML IJ SOLN
0.5000 mg | Freq: Once | INTRAMUSCULAR | Status: AC
  Administered 2021-03-20: 0.5 mg via INTRAMUSCULAR
  Filled 2021-03-20: qty 1

## 2021-03-20 MED ORDER — OXYCODONE-ACETAMINOPHEN 5-325 MG PO TABS: 1.0000 | ORAL_TABLET | Freq: Three times a day (TID) | ORAL | 0 refills | Status: AC | PRN

## 2021-03-20 MED ORDER — ONDANSETRON 8 MG PO TBDP
8.0000 mg | ORAL_TABLET | Freq: Once | ORAL | Status: AC
  Administered 2021-03-20: 8 mg via ORAL
  Filled 2021-03-20: qty 1

## 2021-03-20 MED ORDER — HYDROMORPHONE HCL 1 MG/ML IJ SOLN: 0.5000 mg | Freq: Once | INTRAMUSCULAR | Status: DC

## 2021-03-20 NOTE — ED Provider Notes (Signed)
[facility] EMERGENCY DEPARTMENT Provider Note   CSN: [id number] Arrival date & time: 03/20/21  1105     History Chief Complaint  Patient presents with   Back Pain    Christy Lyons is a 45 y.o. female.  HPI  Patient with significant medical history of kidney stones, migraines presents with chief complaint of upper left shoulder pain.  Patient states this started a few days ago, states the pain has gotten worse.  She states pain remains in her left shoulder, will occasionally radiate down her left arm, she describes it as a constant dull sensation, is worse when she moves her arm or moves her shoulders, she has no associated chest pain, shortness of breath, worsening pedal edema or orthopnea, she has no significant cardiac history, denies history of IV drug use, denies traumatic injury to the area, no associated fevers chills neck pain or headaches.  She states that she has history of chronic pain in this area, states she has chronic spasms, she attempted to take NSAIDs, Toradol, Flexeril without much relief.  Patient states she is going to her neurosurgeon tomorrow for trigger point injections.  States she is here to just needs some pain relief.  She has no other complaints.  She does not endorse any stomach pain, nausea, vomiting, diarrhea, denies flank pain, urinary symptoms.  Past Medical History:  Diagnosis Date   High cholesterol    Kidney stone    Migraine     Patient Active Problem List   Diagnosis Date Noted   Right arm pain 05/14/2017    Past Surgical History:  Procedure Laterality Date   CESAREAN SECTION     KNEE SURGERY       OB History   No obstetric history on file.     No family history on file.  Social History   Tobacco Use   Smoking status: Never  Substance Use Topics   Alcohol use: Yes   Drug use: No    Home Medications Prior to Admission medications   Medication Sig Start Date End Date Taking? Authorizing Provider  ALPRAZolam Prudy Feeler) 0.5 MG  tablet Take 0.5 mg by mouth daily as needed for anxiety. 03/12/21  Yes [provider]  atorvastatin (LIPITOR) 40 MG tablet Take 20-40 mg by mouth every evening.    Yes [provider]  cetirizine (ZYRTEC) 10 MG tablet Take 10 mg by mouth daily.   Yes [provider]  cyclobenzaprine (FLEXERIL) 10 MG tablet Take 5-10 mg by mouth 3 (three) times daily as needed for muscle spasms. 05/17/20  Yes [provider]  escitalopram (LEXAPRO) 20 MG tablet Take 10-20 mg by mouth at bedtime.    Yes [provider]  levonorgestrel (MIRENA) 20 MCG/DAY IUD 1 each by Intrauterine route once.   Yes [provider]  meloxicam (MOBIC) 15 MG tablet Take 15 mg by mouth daily.   Yes [provider]  traZODone (DESYREL) 50 MG tablet Take 50-100 mg by mouth at bedtime. 02/20/21  Yes [provider]  ibuprofen (ADVIL,MOTRIN) 600 MG tablet Take 1 tablet (600 mg total) by mouth every 6 (six) hours as needed for mild pain or moderate pain (take with food/crackers). Patient not taking: Reported on 03/20/2021 10/09/16   [name], MD  methocarbamol (ROBAXIN) 500 MG tablet Take 1 tablet (500 mg total) by mouth every 8 (eight) hours as needed for muscle spasms. Patient not taking: No sig reported 10/09/16   [name], MD  ondansetron [facility]  ODT) 4 MG disintegrating tablet Take 1 tablet (4 mg total) by mouth every 8 (eight) hours as needed for nausea or vomiting. Patient not taking: Reported on 03/20/2021 08/13/20   [name], PA-C  oxyCODONE-acetaminophen (PERCOCET/ROXICET) 5-325 MG tablet Take 1 tablet by mouth every 6 (six) hours as needed for up to 9 doses for severe pain. Patient not taking: Reported on 03/20/2021 08/13/20   [name], PA-C    Allergies    Hydrocodone  Review of Systems   Review of Systems  Constitutional:  Negative for chills and fever.  HENT:  Negative for congestion.   Respiratory:  Negative for shortness of breath.    Cardiovascular:  Negative for chest pain.  Gastrointestinal:  Negative for abdominal pain.  Genitourinary:  Negative for dysuria, enuresis and flank pain.  Musculoskeletal:  Positive for back pain.  Skin:  Negative for rash.  Neurological:  Negative for dizziness.  Hematological:  Does not bruise/bleed easily.   Physical Exam Updated Vital Signs BP 139/84   Pulse 68   Temp 98.5 F (36.9 C) (Oral)   Resp 18   Ht 5\' 6"  (1.676 m)   SpO2 95%   BMI 26.63 kg/m   Physical Exam Vitals and nursing note reviewed.  Constitutional:      General: She is not in acute distress.    Appearance: She is not ill-appearing.  HENT:     Head: Normocephalic and atraumatic.     Nose: No congestion.  Eyes:     Conjunctiva/sclera: Conjunctivae normal.  Cardiovascular:     Rate and Rhythm: Normal rate and regular rhythm.     Pulses: Normal pulses.     Heart sounds: No murmur heard.   No friction rub. No gallop.  Pulmonary:     Effort: No respiratory distress.     Breath sounds: No wheezing, rhonchi or rales.  Abdominal:     Palpations: Abdomen is soft.     Tenderness: There is no abdominal tenderness. There is no right CVA tenderness or left CVA tenderness.  Musculoskeletal:     Right lower leg: No edema.     Left lower leg: No edema.     Comments: Spine was palpated was nontender to palpation, no step-off deformities present, patient had noted tenderness between the medial border of the left scapula and the lateral aspect of the thoracic spine, no deformities present, patient has full range of motion, 5 of 5 strength in the upper extremities, neurovascular fully intact.  Skin:    General: Skin is warm and dry.  Neurological:     Mental Status: She is alert.  Psychiatric:        Mood and Affect: Mood normal.    ED Results / Procedures / Treatments   Labs (all labs ordered are listed, but only abnormal results are displayed) Labs Reviewed - No data to  display  EKG None  Radiology No results found.  Procedures Procedures   Medications Ordered in ED Medications  HYDROmorphone (DILAUDID) injection 1 mg (1 mg Intramuscular Given 03/20/21 1239)  ondansetron (ZOFRAN-ODT) disintegrating tablet 8 mg (8 mg Oral Given 03/20/21 1239)  HYDROmorphone (DILAUDID) injection 0.5 mg (0.5 mg Intramuscular Given 03/20/21 1334)    ED Course  I have reviewed the triage vital signs and the nursing notes.  Pertinent labs & imaging results that were available during my care of the patient were reviewed by me and considered in my medical decision making (see chart for details).  MDM Rules/Calculators/A&P                          Initial impression-patient presents with left shoulder pain.  She is alert, does not appear in acute stress, vital signs reassuring.  Work-up-due to well-appearing patient, benign physical exam, further lab work and imaging not warranted at this time.  Reassessment-patient was assessed after pain medication, see states he is feeling much better, has no complaints this time, patient agreed for discharge.  Rule out- I have low suspicion for spinal fracture or spinal cord abnormality as patient denies urinary incontinency, retention, difficulty with bowel movements, denies saddle paresthesias.  Spine was palpated there is no step-off, crepitus or gross deformities felt, patient had 5/5 strength, full range of motion, neurovascular fully intact in the upper extremities.  Will defer imaging at this time as there is no traumatic injury associated with this pain.. Low suspicion for septic arthritis as patient denies IV drug use, skin exam was performed no erythematous, edema or warm joints noted.  Low suspicion for ACS as patient denies chest pain, shortness of breath, patient has low risk factors.  Low suspicion for AAA and/or dissection as presentation is atypical of etiology.   Plan-  Left-sided shoulder pain-patient be acute on  chronic, will have her continue with home medications, follow-up with neurosurgeon tomorrow for further evaluation.  Vital signs have remained stable, no indication for hospital admission.   Patient given at home care as well strict return precautions.  Patient verbalized that they understood agreed to said plan.  Final Clinical Impression(s) / ED Diagnoses Final diagnoses:  Thoracic spine pain    Rx / DC Orders ED Discharge Orders     None        Carroll Sage, PA-C 03/20/21 1436    Pollyann Savoy, MD 03/20/21 [phone]

## 2021-03-20 NOTE — ED Triage Notes (Signed)
Pain in left upper back x 2 days

## 2021-03-20 NOTE — Discharge Instructions (Addendum)
You have been seen here for back pain. I recommend taking over-the-counter pain medications like ibuprofen and/or Tylenol every 6 as needed.  Please follow dosage and on the back of bottle.  I also recommend applying heat to the area and stretching out the muscles as this will help decrease stiffness and pain.  I have given you information on exercises please follow.  Please follow-up with neurosurgeon tomorrow.  Come back to the emergency department if you develop chest pain, shortness of breath, severe abdominal pain, uncontrolled nausea, vomiting, diarrhea.

## 2021-03-21 MED FILL — Oxycodone w/ Acetaminophen Tab 5-325 MG: ORAL | Qty: 6 | Status: AC

## 2023-02-10 IMAGING — CT CT ABD-PELV W/ CM
2 of 5 series · 15 of 46 positions shown, 17 images · IV contrast (Omnipaque or Isovue)
Comparison: None.

CLINICAL DATA: Right lower quadrant abdominal pain and nausea.
History of nephrolithiasis.

EXAM:
CT ABDOMEN AND PELVIS WITH CONTRAST
TECHNIQUE: Multidetector CT imaging of the abdomen and pelvis was performed
using the standard protocol following bolus administration of
intravenous contrast.
CONTRAST:  100mL OMNIPAQUE IOHEXOL 300 MG/ML  SOLN

[Series 2: axial st · axial · 0.83mm/px · z∈[+918,+1358]mm · 12 of 98 slices shown, 14 images]
[im 5/98  soft-tissue]
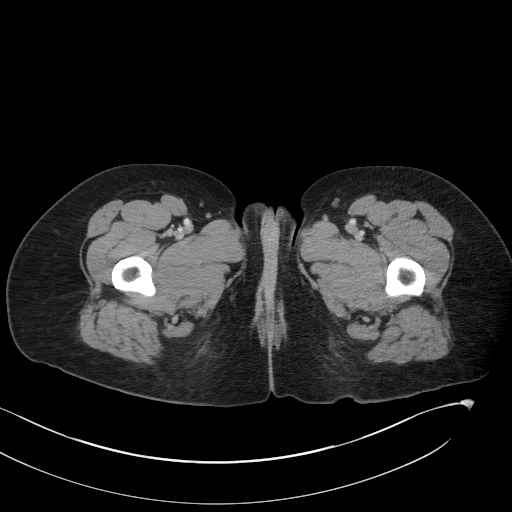
[im 5/98  bone]
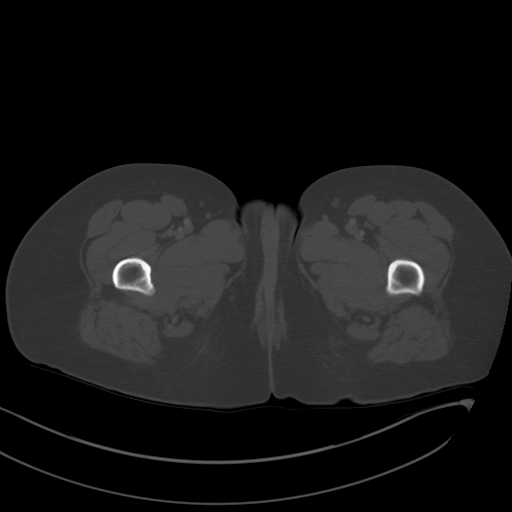
[im 14/98  soft-tissue]
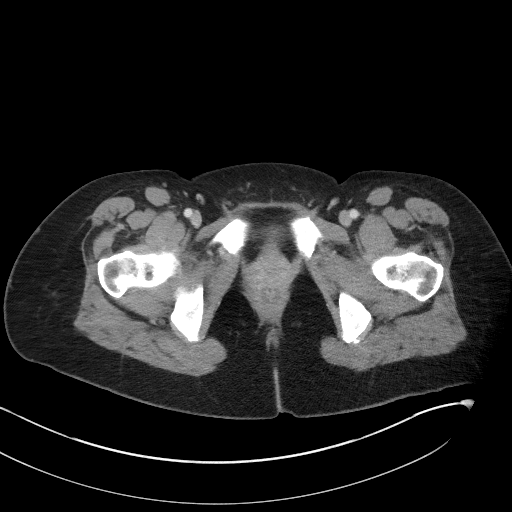
[im 24/98  soft-tissue]
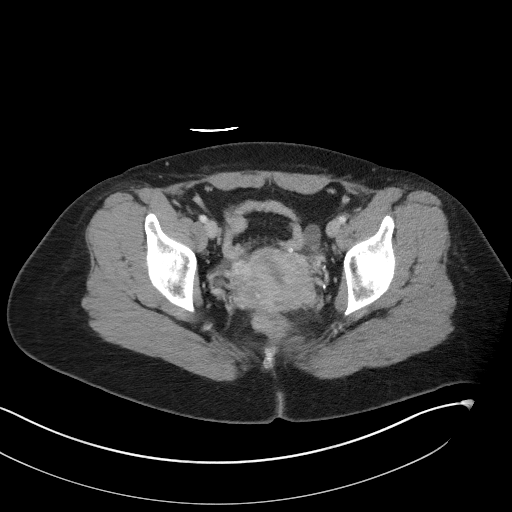
[im 28/98  soft-tissue]
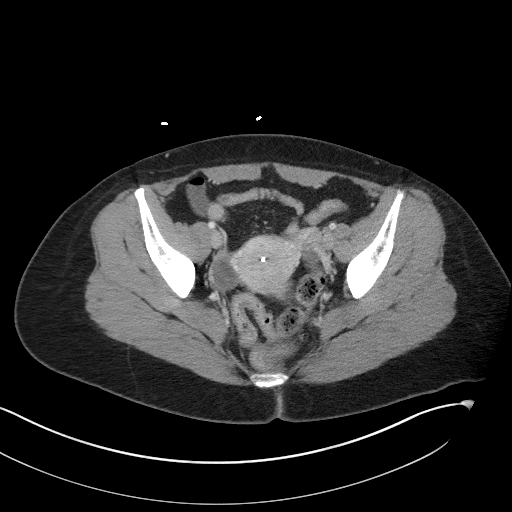
[im 37/98  soft-tissue]
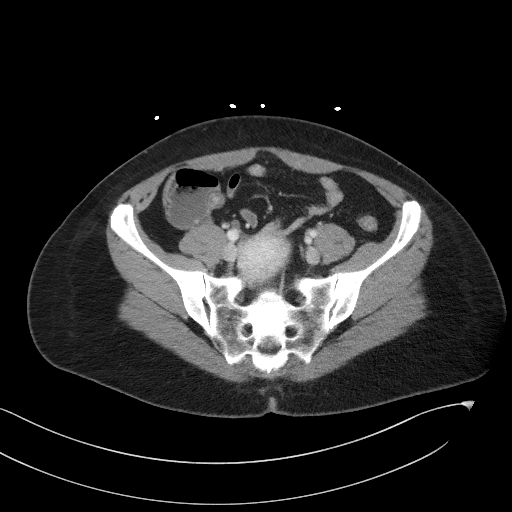
[im 47/98  soft-tissue]
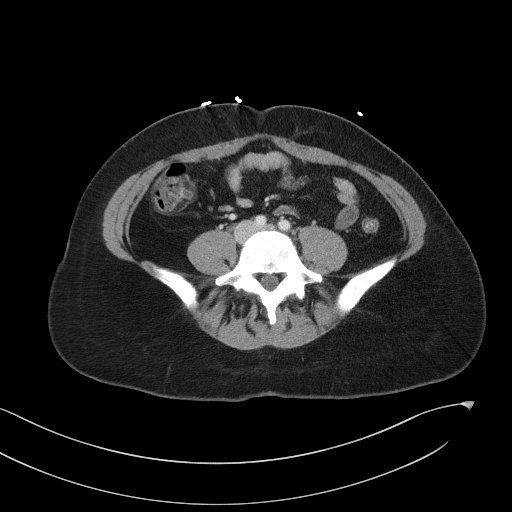
[im 51/98  soft-tissue]
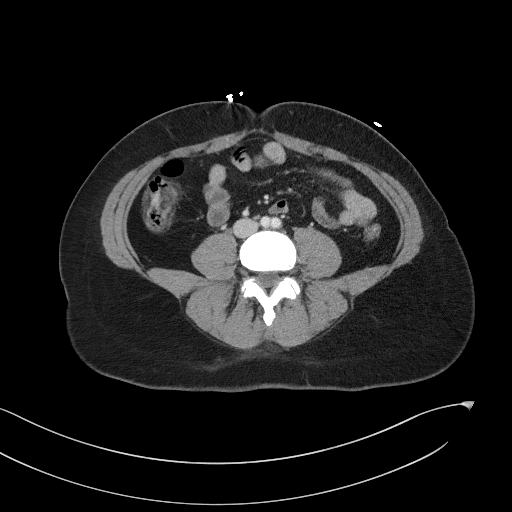
[im 61/98  soft-tissue]
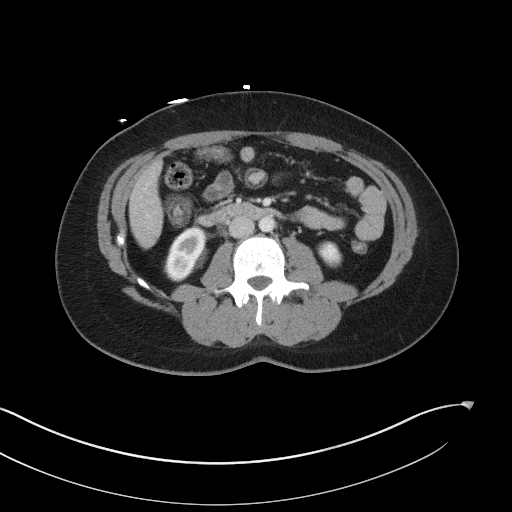
[im 70/98  soft-tissue]
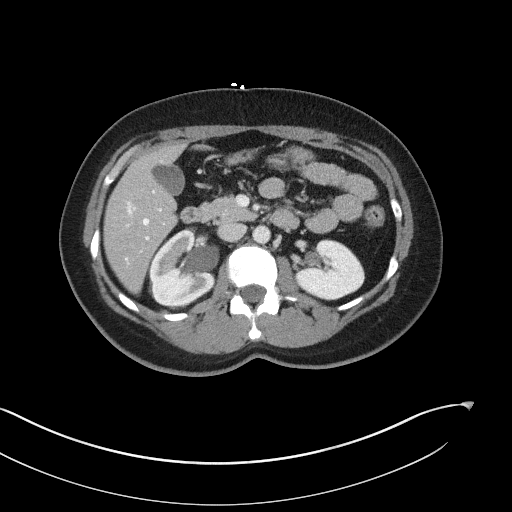
[im 70/98  bone]
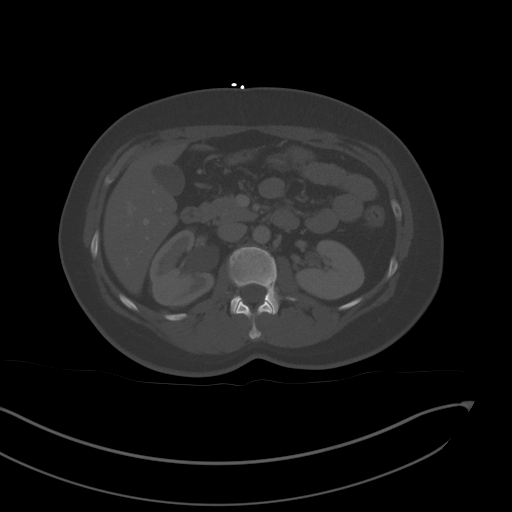
[im 74/98  soft-tissue]
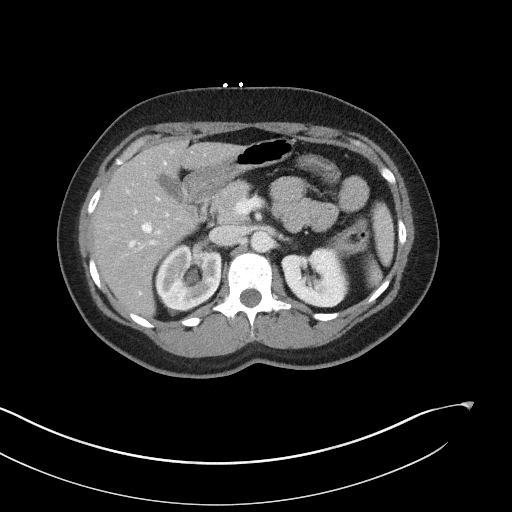
[im 84/98  soft-tissue]
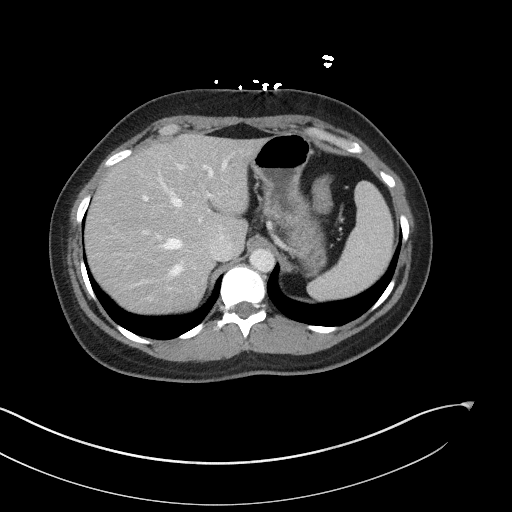
[im 93/98  soft-tissue]
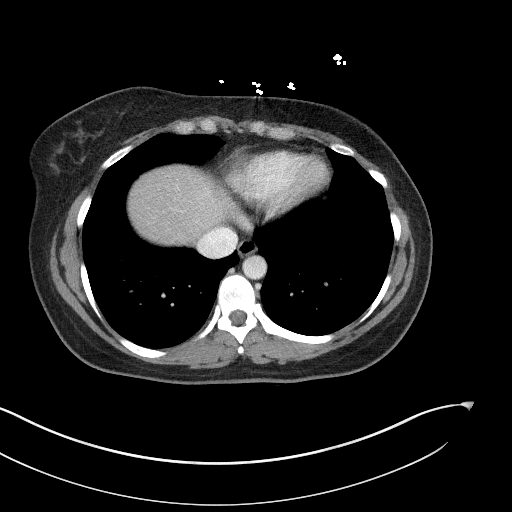

[Series 6: coronal st · coronal · 0.78mm/px · 3 of 95 slices shown]
[im 32/95  soft-tissue]
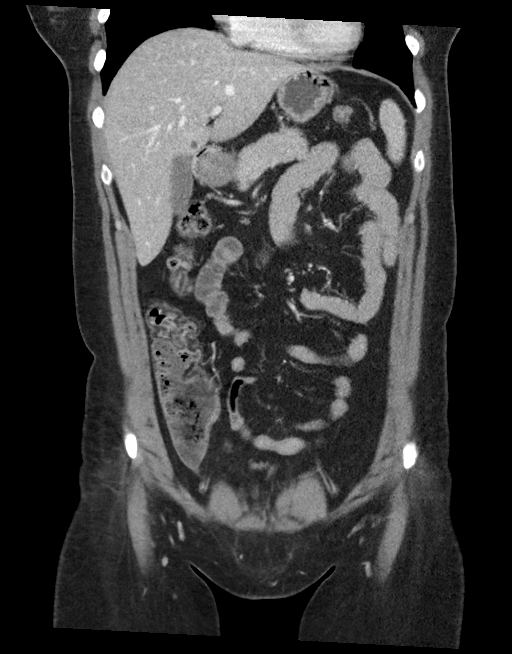
[im 42/95  soft-tissue]
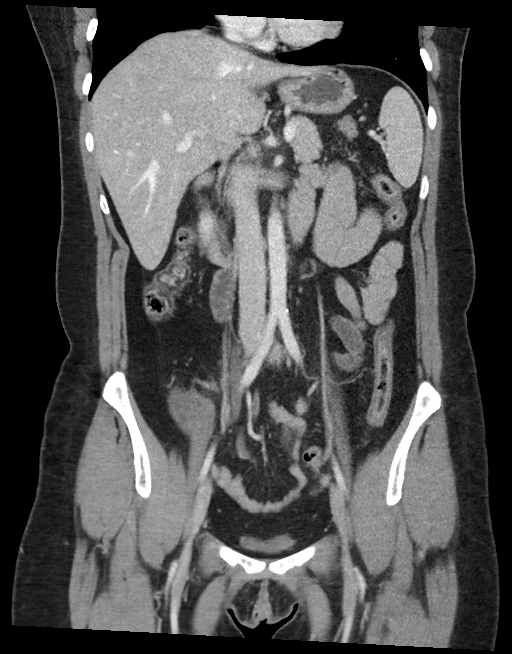
[im 53/95  soft-tissue]
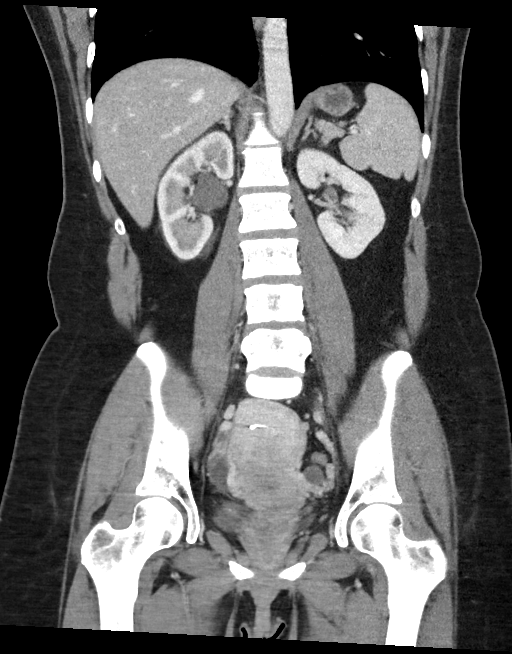

[15 of 46 positions shown; findings below may reference images not displayed]

FINDINGS: Lower chest: No significant pulmonary nodules or acute consolidative
airspace disease.

Hepatobiliary: Normal liver size. Two scattered subcentimeter
hypodense left liver lesions are too small to characterize and
require no follow-up unless the patient has risk factors for liver
malignancy. No additional liver lesions. Normal gallbladder with no
radiopaque cholelithiasis. No biliary ductal dilatation.

Pancreas: Normal, with no mass or duct dilation.

Spleen: Normal size. No mass.

Adrenals/Urinary Tract: Normal adrenals. Obstructing 2 mm right
ureterovesical junction stone with mild right hydroureteronephrosis
and delayed right contrast nephrogram. Asymmetric mild right
perinephric edema. No contour deforming renal masses. No left
hydronephrosis. Normal collapsed bladder.

Stomach/Bowel: Normal non-distended stomach. Normal caliber small
bowel with no small bowel wall thickening. Normal appendix. Normal
large bowel with no diverticulosis, large bowel wall thickening or
pericolonic fat stranding.

Vascular/Lymphatic: Minimally atherosclerotic nonaneurysmal
abdominal aorta. Patent portal, splenic, hepatic and renal veins. No
pathologically enlarged lymph nodes in the abdomen or pelvis.

Reproductive: Retroverted uterus with 2.3 cm posterior right uterine
body fibroid and grossly well-positioned intrauterine device in the
uterine cavity. Bilateral simple adnexal cysts, largest 2.5 cm on
the right (series 2/image 72). No follow-up imaging recommended.
Note: This recommendation does not apply to premenarchal patients
and to those with increased risk (genetic, family history, elevated
tumor markers or other high-risk factors) of ovarian cancer.
Reference: JACR [DATE]):[id number]

Other: No pneumoperitoneum, ascites or focal fluid collection.

Musculoskeletal: No aggressive appearing focal osseous lesions.
IMPRESSION: 1. Obstructing 2 mm right UVJ stone with mild right
hydroureteronephrosis.
2. Small uterine fibroid.
3. Aortic Atherosclerosis ([id number]-MA6.6).

## 2024-04-24 ENCOUNTER — Other Ambulatory Visit (HOSPITAL_COMMUNITY): Payer: Self-pay

## 2024-05-01 ENCOUNTER — Other Ambulatory Visit (HOSPITAL_COMMUNITY): Payer: Self-pay
